# Patient Record
Sex: Male | Born: 1985 | Race: White | Hispanic: No | Marital: Married | Smoking: Never smoker
Health system: Southern US, Community
[De-identification: ages and names within clinical notes are randomized; demographics above are authoritative.]

## PROBLEM LIST (undated history)

## (undated) DIAGNOSIS — G43909 Migraine, unspecified, not intractable, without status migrainosus: Secondary | ICD-10-CM

## (undated) HISTORY — PX: MEDIAL COLLATERAL LIGAMENT REPAIR, KNEE: SHX2019

## (undated) HISTORY — DX: Migraine, unspecified, not intractable, without status migrainosus: G43.909

## (undated) HISTORY — PX: LYMPH NODE BIOPSY: SHX201

## (undated) HISTORY — PX: ANTERIOR CRUCIATE LIGAMENT REPAIR: SHX115

## (undated) HISTORY — PX: OTHER SURGICAL HISTORY: SHX169

---

## 2014-10-06 ENCOUNTER — Other Ambulatory Visit: Payer: Self-pay | Admitting: Family Medicine

## 2014-10-06 ENCOUNTER — Ambulatory Visit
Admission: RE | Admit: 2014-10-06 | Discharge: 2014-10-06 | Disposition: A | Discharge status: Home / Self Care | Payer: BLUE CROSS/BLUE SHIELD | Source: Ambulatory Visit | Attending: Family Medicine | Admitting: Family Medicine

## 2014-10-06 DIAGNOSIS — R1031 Right lower quadrant pain: Secondary | ICD-10-CM

## 2014-10-06 MED ORDER — IOHEXOL 350 MG/ML SOLN
100.0000 mL | Freq: Once | INTRAVENOUS | Status: AC | PRN
  Administered 2014-10-06: 100 mL via INTRAVENOUS

## 2015-12-15 IMAGING — CT CT ABD-PELV W/ CM
2 of 5 series · 16 of 46 positions shown, 18 images · IV contrast (omnipaque)
Comparison: None.

CLINICAL DATA: Right lower quadrant pain.  Rule out appendicitis.

EXAM:
CT ABDOMEN AND PELVIS WITH CONTRAST
TECHNIQUE: Multidetector CT imaging of the abdomen and pelvis was performed
using the standard protocol following bolus administration of
intravenous contrast.
CONTRAST:  100mL OMNIPAQUE IOHEXOL 350 MG/ML SOLN

[Series 2: routine with · axial · 0.79mm/px · z∈[-1066,-591]mm · 13 of 105 slices shown, 15 images]
[im 5/105  soft-tissue]
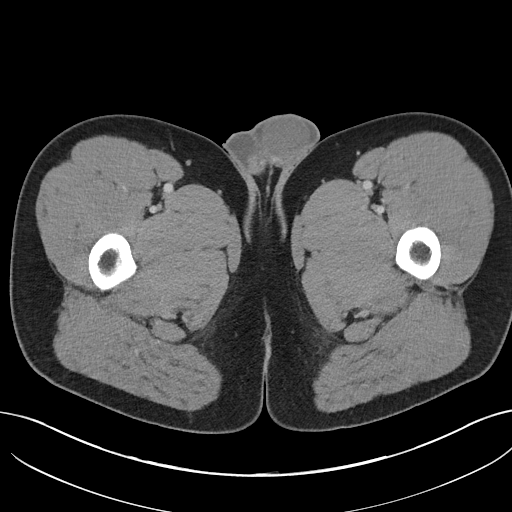
[im 5/105  bone]
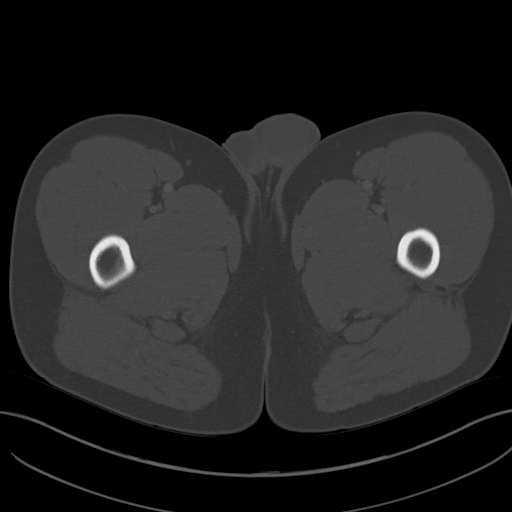
[im 14/105  soft-tissue]
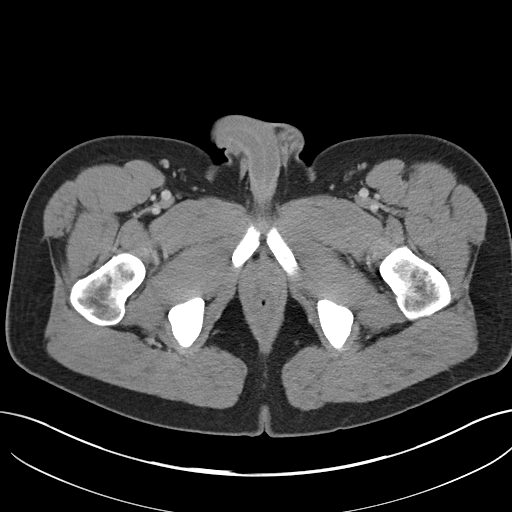
[im 23/105  soft-tissue]
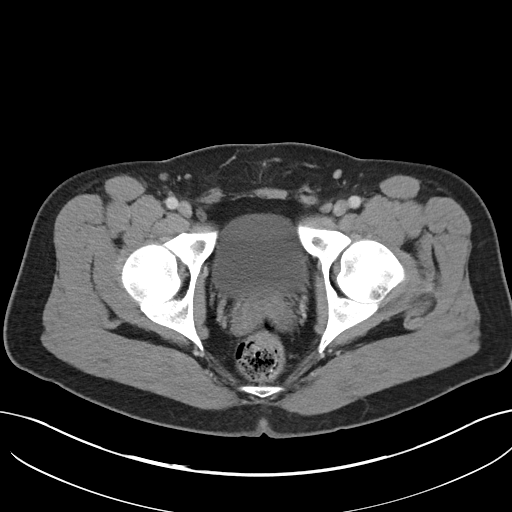
[im 28/105  soft-tissue]
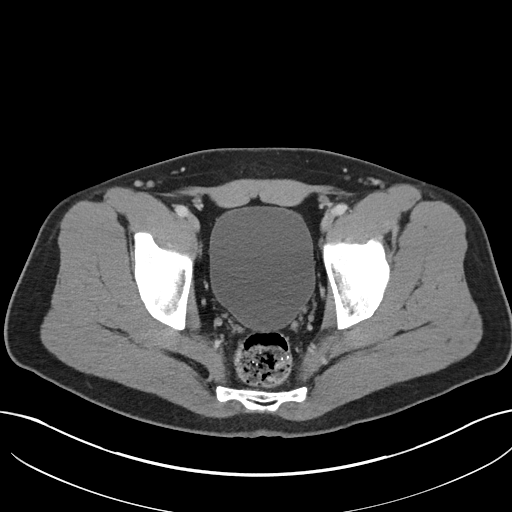
[im 37/105  soft-tissue]
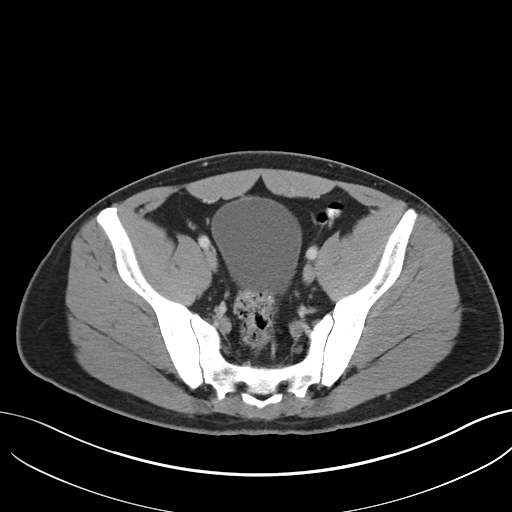
[im 46/105  soft-tissue]
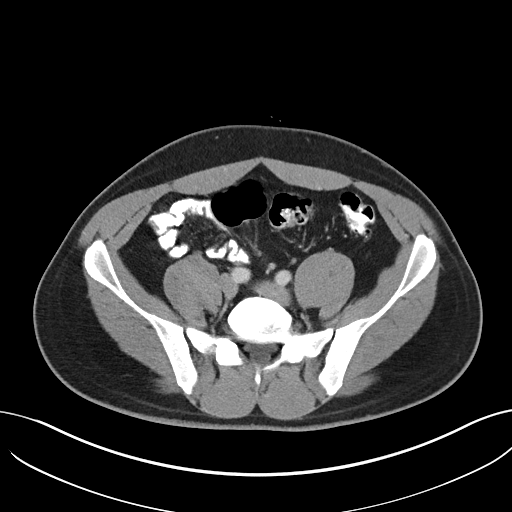
[im 55/105  soft-tissue]
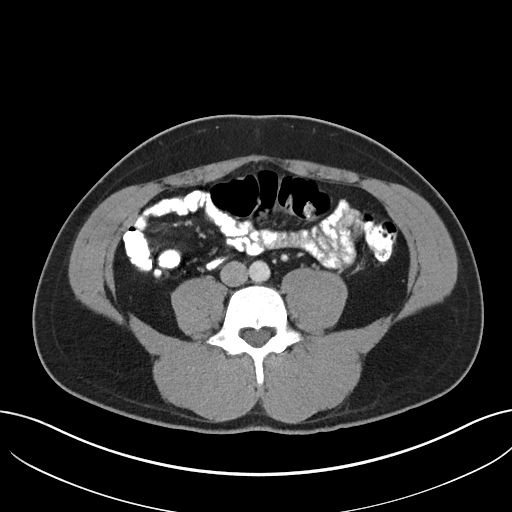
[im 59/105  soft-tissue]
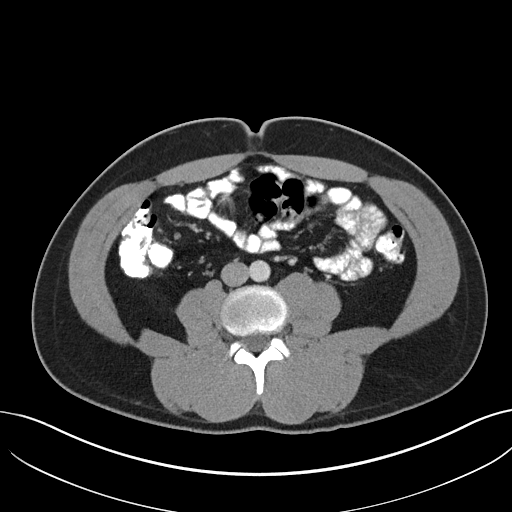
[im 68/105  soft-tissue]
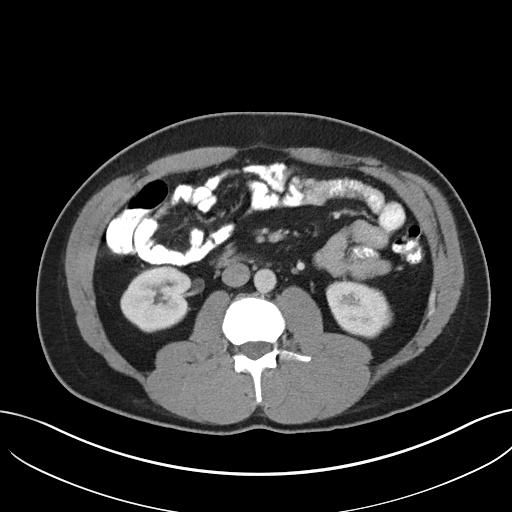
[im 68/105  bone]
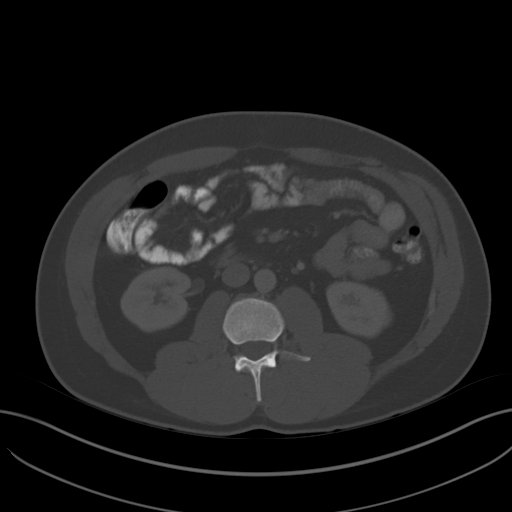
[im 77/105  soft-tissue]
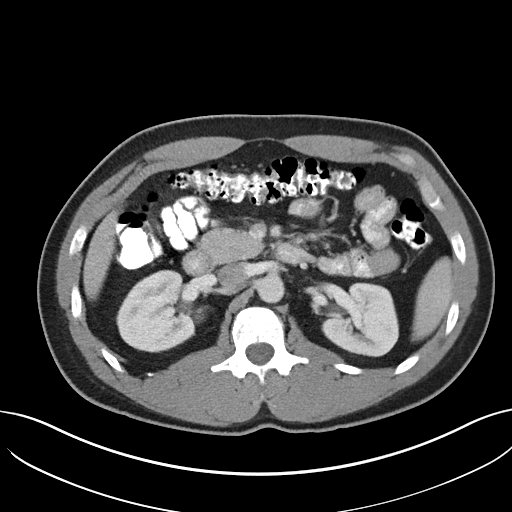
[im 82/105  soft-tissue]
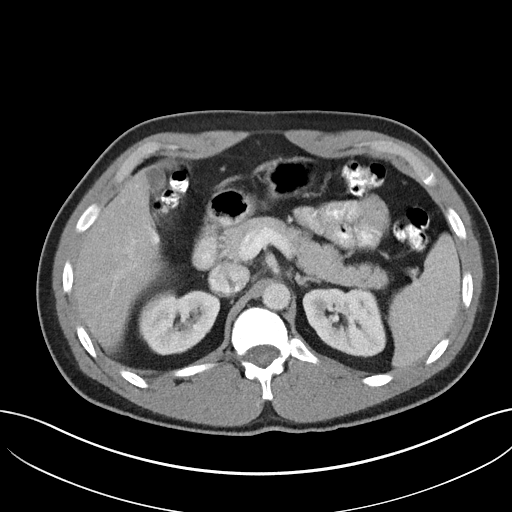
[im 91/105  soft-tissue]
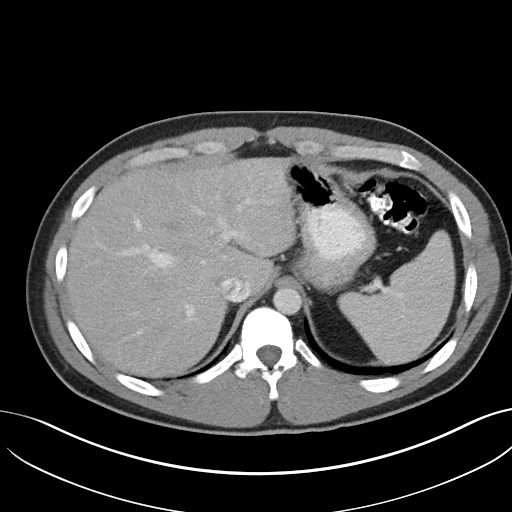
[im 100/105  soft-tissue]
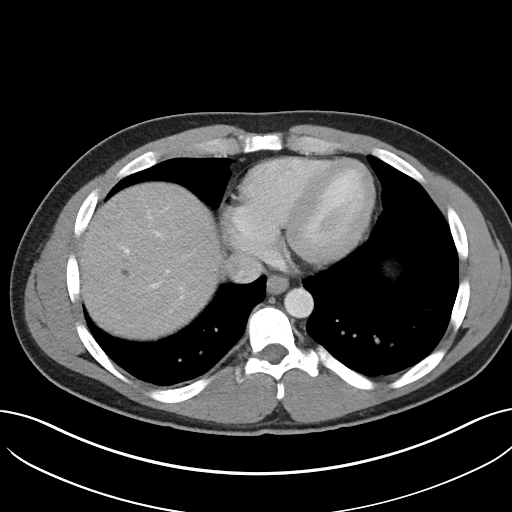

[Series 8: cor routine with · coronal · 0.80mm/px · 3 of 137 slices shown]
[im 46/137  soft-tissue]
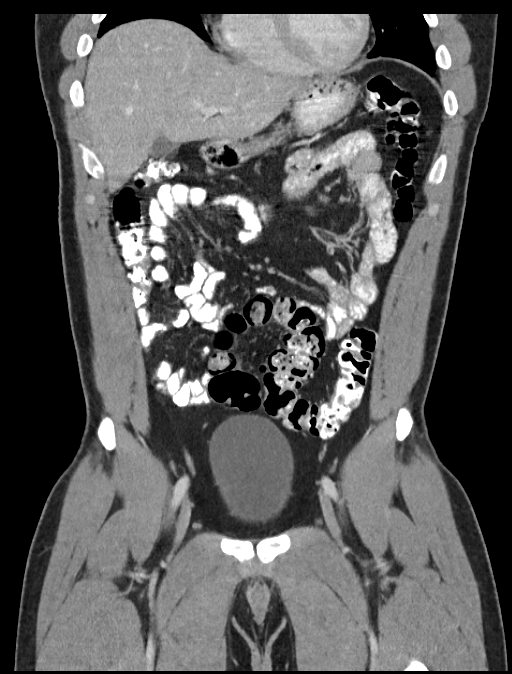
[im 61/137  soft-tissue]
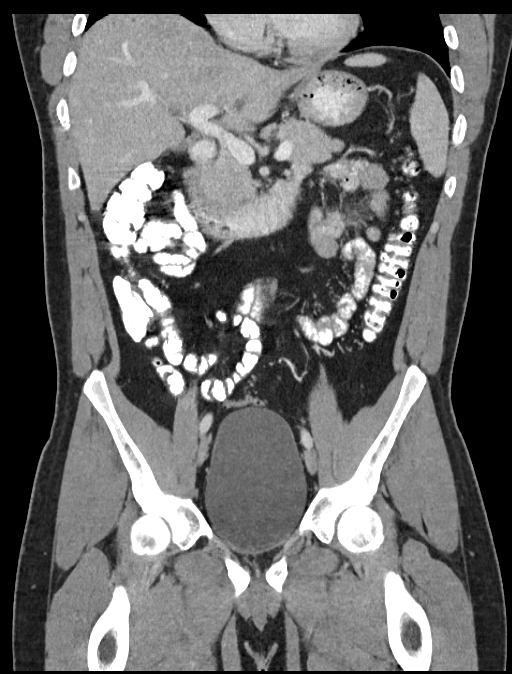
[im 76/137  soft-tissue]
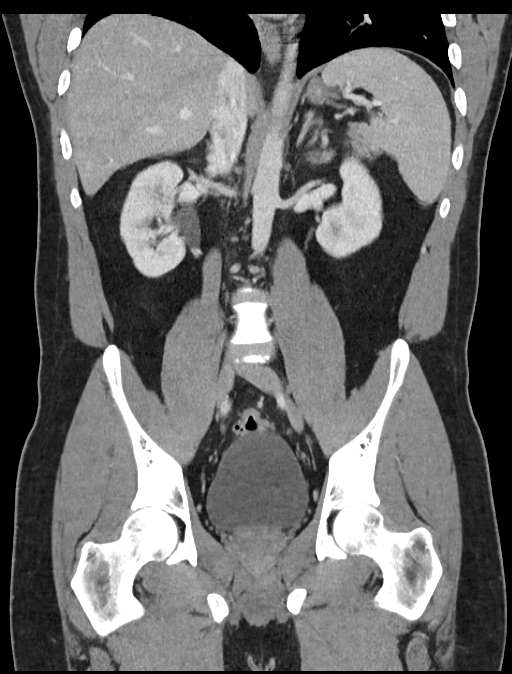

[16 of 46 positions shown; findings below may reference images not displayed]

FINDINGS: Lung bases are clear.  Negative for infiltrate or effusion.

1 cm cyst in the dome of the liver. No liver mass lesion. Normal
spleen. Normal gallbladder and bile ducts. Normal pancreas

Normal kidneys. No renal mass or obstruction. No renal calculi.
Urinary bladder normal.

Negative for bowel obstruction. Normal appendix. Negative for bowel
edema. No free fluid.

Negative for mass or adenopathy.

Normal lumbar spine.
IMPRESSION: No acute abnormality.  Normal appendix.

These results will be called to the ordering clinician or
representative by the Radiologist Assistant, and communication
documented in the PACS or zVision Dashboard.

## 2016-08-05 ENCOUNTER — Ambulatory Visit: Payer: BLUE CROSS/BLUE SHIELD | Admitting: Family Medicine

## 2016-09-01 ENCOUNTER — Ambulatory Visit (INDEPENDENT_AMBULATORY_CARE_PROVIDER_SITE_OTHER): Payer: 59 | Admitting: Family Medicine

## 2016-09-01 ENCOUNTER — Encounter: Payer: Self-pay | Admitting: Family Medicine

## 2016-09-01 VITALS — BP 138/96 | HR 76 | Temp 98.0°F | Ht 71.0 in | Wt 232.2 lb

## 2016-09-01 DIAGNOSIS — Z23 Encounter for immunization: Secondary | ICD-10-CM | POA: Diagnosis not present

## 2016-09-01 DIAGNOSIS — Z0000 Encounter for general adult medical examination without abnormal findings: Secondary | ICD-10-CM | POA: Insufficient documentation

## 2016-09-01 LAB — COMPREHENSIVE METABOLIC PANEL
ALBUMIN: 5 g/dL (ref 3.5–5.2)
ALT: 52 U/L (ref 0–53)
AST: 37 U/L (ref 0–37)
Alkaline Phosphatase: 44 U/L (ref 39–117)
BUN: 11 mg/dL (ref 6–23)
CO2: 25 mEq/L (ref 19–32)
Calcium: 9.8 mg/dL (ref 8.4–10.5)
Chloride: 105 mEq/L (ref 96–112)
Creatinine, Ser: 0.93 mg/dL (ref 0.40–1.50)
GFR: 100.61 mL/min (ref >60.00)
GLUCOSE: 86 mg/dL (ref 70–99)
Potassium: 4.5 mEq/L (ref 3.5–5.1)
SODIUM: 138 meq/L (ref 135–145)
TOTAL PROTEIN: 7.4 g/dL (ref 6.0–8.3)
Total Bilirubin: 0.4 mg/dL (ref 0.2–1.2)

## 2016-09-01 LAB — LIPID PANEL
CHOL/HDL RATIO: 7
Cholesterol: 182 mg/dL (ref 0–200)
HDL: 25.5 mg/dL — ABNORMAL LOW (ref >39.00)
NonHDL: 156.49
Triglycerides: 228 mg/dL — ABNORMAL HIGH (ref 0.0–149.0)
VLDL: 45.6 mg/dL — AB (ref 0.0–40.0)

## 2016-09-01 LAB — CBC
HCT: 46.9 % (ref 39.0–52.0)
Hemoglobin: 16.1 g/dL (ref 13.0–17.0)
MCHC: 34.3 g/dL (ref 30.0–36.0)
MCV: 82.8 fl (ref 78.0–100.0)
PLATELETS: 229 10*3/uL (ref 150.0–400.0)
RBC: 5.67 Mil/uL (ref 4.22–5.81)
RDW: 13.6 % (ref 11.5–15.5)
WBC: 7.4 10*3/uL (ref 4.0–10.5)

## 2016-09-01 LAB — HEMOGLOBIN A1C: Hgb A1c MFr Bld: 4.5 % — ABNORMAL LOW (ref 4.6–6.5)

## 2016-09-01 LAB — LDL CHOLESTEROL, DIRECT: LDL DIRECT: 135 mg/dL

## 2016-09-01 NOTE — Addendum Note (Signed)
Addended by: Alisia FerrariBARE, KRISTEN C on: 09/01/2016 11:45 AM   Modules accepted: Orders

## 2016-09-01 NOTE — Assessment & Plan Note (Signed)
Labs today. Tdap given today. Advised continued exercise and to watch diet/alcohol intake. BP mildly elevated today. We'll continue to monitor.

## 2016-09-01 NOTE — Progress Notes (Signed)
Subjective:  Patient ID: Jeffery Kirk, male    DOB: 13-Jul-1985  Age: 31 y.o. MRN: [id number]  CC: Physical  HPI Jeffery Kirk is a 31 y.o. male presents to the clinic today to establish care and with a physical.  Preventative Healthcare  Immunizations  Tetanus - In need of Tdap (expecting 1st child later this year).  Labs: Screening labs today.   Exercise: Lifts/cardio 3x/week  Alcohol use: Yes. See below.  Smoking/tobacco use: No.  Regular dental exams: Yes.   Wears seat belt: Yes.   PMH, Surgical Hx, Family Hx, Social History reviewed and updated as below.  Past Medical History:  Diagnosis Date  . Migraine    Past Surgical History:  Procedure Laterality Date  . ANTERIOR CRUCIATE LIGAMENT REPAIR    . LYMPH NODE BIOPSY    . MEDIAL COLLATERAL LIGAMENT REPAIR, KNEE    . thumb surgery     Ligament repair   Family History  Problem Relation Age of Onset  . Hypertension Mother   . Mental illness Mother   . Hypertension Father   . Alcohol abuse Father   . Hypertension Maternal Grandmother   . Hypertension Maternal Grandfather   . Hypertension Paternal Grandmother   . Hypertension Paternal Grandfather    Social History  Substance Use Topics  . Smoking status: Never Smoker  . Smokeless tobacco: Former Neurosurgeon  . Alcohol use 4.2 oz/week    4 Cans of beer, 3 Shots of liquor per week    Review of Systems General: Denies unexplained weight loss, fever. Skin: Denies new or changing mole, sore/wound that won't heal. ENT: Trouble hearing, ringing in the ears, sores in the mouth, hoarseness, trouble swallowing. Eyes: Denies trouble seeing/visual disturbance. Heart/CV: Denies chest pain, shortness of breath, edema, palpitations. Lungs/Resp: Denies cough, shortness of breath, hemoptysis. Abd/GI: Denies nausea, vomiting, diarrhea, constipation, abdominal pain, hematochezia, melena. GU: Denies dysuria, incontinence, hematuria, urinary frequency, difficulty  starting/keeping stream, penile discharge, sexual difficulty, lump in testicles. MSK: Denies joint pain/swelling, myalgias. Neuro: Denies headaches, weakness, numbness, dizziness, syncope. Psych: Denies sadness, anxiety, stress, memory difficulty. Endocrine: Denies polyuria and polydipsia.  Objective:   Today's Vitals: BP (!) 138/96 (BP Location: Right Arm, Patient Position: Sitting, Cuff Size: Large)   Pulse 76   Temp 98 F (36.7 C) (Oral)   Ht 5\' 11"  (1.803 m)   Wt 232 lb 4 oz (105.3 kg)   SpO2 98%   BMI 32.39 kg/m   Physical Exam  Constitutional: He is oriented to person, place, and time. He appears well-developed. No distress.  HENT:  Head: Normocephalic and atraumatic.  Mouth/Throat: Oropharynx is clear and moist.  Eyes: Conjunctivae are normal. No scleral icterus.  Neck: Neck supple.  Cardiovascular: Normal rate and regular rhythm.   Pulmonary/Chest: Effort normal and breath sounds normal.  Abdominal: Soft. He exhibits no distension. There is no tenderness. There is no rebound and no guarding.  Musculoskeletal: Normal range of motion.  Lymphadenopathy:    He has no cervical adenopathy.  Neurological: He is alert and oriented to person, place, and time.  Skin: Skin is warm. No rash noted.  Psychiatric: He has a normal mood and affect.  Vitals reviewed.  Assessment & Plan:   Problem List Items Addressed This Visit    Annual physical exam - Primary    Labs today. Tdap given today. Advised continued exercise and to watch diet/alcohol intake. BP mildly elevated today. We'll continue to monitor.      Relevant Orders  CBC   Hemoglobin A1c   Comprehensive metabolic panel   Lipid panel     Follow-up: Annually  Everlene OtherJayce Tanga Gloor DO [facility] [place]

## 2016-09-01 NOTE — Patient Instructions (Signed)
Follow up annually.  Call me with concerns.  Take care  Dr. Adriana Simasook    Health Maintenance, Male A healthy lifestyle and preventive care is important for your health and wellness. Ask your health care provider about what schedule of regular examinations is right for you. What should I know about weight and diet?  Eat a Healthy Diet  Eat plenty of vegetables, fruits, whole grains, low-fat dairy products, and lean protein.  Do not eat a lot of foods high in solid fats, added sugars, or salt. Maintain a Healthy Weight  Regular exercise can help you achieve or maintain a healthy weight. You should:  Do at least 150 minutes of exercise each week. The exercise should increase your heart rate and make you sweat (moderate-intensity exercise).  Do strength-training exercises at least twice a week. Watch Your Levels of Cholesterol and Blood Lipids  Have your blood tested for lipids and cholesterol every 5 years starting at 31 years of age. If you are at high risk for heart disease, you should start having your blood tested when you are 31 years old. You may need to have your cholesterol levels checked more often if:  Your lipid or cholesterol levels are high.  You are older than 31 years of age.  You are at high risk for heart disease. What should I know about cancer screening? Many types of cancers can be detected early and may often be prevented. Lung Cancer  You should be screened every year for lung cancer if:  You are a current smoker who has smoked for at least 30 years.  You are a former smoker who has quit within the past 15 years.  Talk to your health care provider about your screening options, when you should start screening, and how often you should be screened. Colorectal Cancer  Routine colorectal cancer screening usually begins at 31 years of age and should be repeated every 5-10 years until you are 31 years old. You may need to be screened more often if early forms of  precancerous polyps or small growths are found. Your health care provider may recommend screening at an earlier age if you have risk factors for colon cancer.  Your health care provider may recommend using home test kits to check for hidden blood in the stool.  A small camera at the end of a tube can be used to examine your colon (sigmoidoscopy or colonoscopy). This checks for the earliest forms of colorectal cancer. Prostate and Testicular Cancer  Depending on your age and overall health, your health care provider may do certain tests to screen for prostate and testicular cancer.  Talk to your health care provider about any symptoms or concerns you have about testicular or prostate cancer. Skin Cancer  Check your skin from head to toe regularly.  Tell your health care provider about any new moles or changes in moles, especially if:  There is a change in a mole's size, shape, or color.  You have a mole that is larger than a pencil eraser.  Always use sunscreen. Apply sunscreen liberally and repeat throughout the day.  Protect yourself by wearing long sleeves, pants, a wide-brimmed hat, and sunglasses when outside. What should I know about heart disease, diabetes, and high blood pressure?  If you are 3218-31 years of age, have your blood pressure checked every 3-5 years. If you are 740 years of age or older, have your blood pressure checked every year. You should have your blood pressure measured  twice-once when you are at a hospital or clinic, and once when you are not at a hospital or clinic. Record the average of the two measurements. To check your blood pressure when you are not at a hospital or clinic, you can use:  An automated blood pressure machine at a pharmacy.  A home blood pressure monitor.  Talk to your health care provider about your target blood pressure.  If you are between 55-65 years old, ask your health care provider if you should take aspirin to prevent heart  disease.  Have regular diabetes screenings by checking your fasting blood sugar level.  If you are at a normal weight and have a low risk for diabetes, have this test once every three years after the age of 41.  If you are overweight and have a high risk for diabetes, consider being tested at a younger age or more often.  A one-time screening for abdominal aortic aneurysm (AAA) by ultrasound is recommended for men aged 3-75 years who are current or former smokers. What should I know about preventing infection? Hepatitis B  If you have a higher risk for hepatitis B, you should be screened for this virus. Talk with your health care provider to find out if you are at risk for hepatitis B infection. Hepatitis C  Blood testing is recommended for:  Everyone born from [birth year] through 1965.  Anyone with known risk factors for hepatitis C. Sexually Transmitted Diseases (STDs)  You should be screened each year for STDs including gonorrhea and chlamydia if:  You are sexually active and are younger than 31 years of age.  You are older than 31 years of age and your health care provider tells you that you are at risk for this type of infection.  Your sexual activity has changed since you were last screened and you are at an increased risk for chlamydia or gonorrhea. Ask your health care provider if you are at risk.  Talk with your health care provider about whether you are at high risk of being infected with HIV. Your health care provider may recommend a prescription medicine to help prevent HIV infection. What else can I do?  Schedule regular health, dental, and eye exams.  Stay current with your vaccines (immunizations).  Do not use any tobacco products, such as cigarettes, chewing tobacco, and e-cigarettes. If you need help quitting, ask your health care provider.  Limit alcohol intake to no more than 2 drinks per day. One drink equals 12 ounces of beer, 5 ounces of wine, or 1 ounces of hard  liquor.  Do not use street drugs.  Do not share needles.  Ask your health care provider for help if you need support or information about quitting drugs.  Tell your health care provider if you often feel depressed.  Tell your health care provider if you have ever been abused or do not feel safe at home. This information is not intended to replace advice given to you by your health care provider. Make sure you discuss any questions you have with your health care provider. Document Released: 10/08/2007 Document Revised: 12/09/2015 Document Reviewed: 01/13/2015 Elsevier Interactive Patient Education  2017 [employer].

## 2018-10-07 ENCOUNTER — Telehealth: Payer: Self-pay | Admitting: Family Medicine

## 2018-10-07 MED ORDER — KETOROLAC TROMETHAMINE 10 MG PO TABS: 10.0000 mg | ORAL_TABLET | Freq: Four times a day (QID) | ORAL | 0 refills | Status: AC | PRN

## 2018-10-07 NOTE — Telephone Encounter (Signed)
Former patient of mine. Suffered 5th metatarsal fracture. Was seen at local urgent care. No boot or post op shoe given. No meds given for pain. Sending in Toradol PRN. Patient to see me tomorrow for evaluation.

## 2018-10-08 ENCOUNTER — Ambulatory Visit
Admission: EM | Admit: 2018-10-08 | Discharge: 2018-10-08 | Disposition: A | Discharge status: Home / Self Care | Payer: BC Managed Care – PPO | Attending: Family Medicine | Admitting: Family Medicine

## 2018-10-08 ENCOUNTER — Other Ambulatory Visit: Payer: Self-pay

## 2018-10-08 DIAGNOSIS — S99921A Unspecified injury of right foot, initial encounter: Secondary | ICD-10-CM

## 2018-10-08 DIAGNOSIS — S92354A Nondisplaced fracture of fifth metatarsal bone, right foot, initial encounter for closed fracture: Secondary | ICD-10-CM

## 2018-10-08 MED ORDER — TRAMADOL HCL 50 MG PO TABS: 50.0000 mg | ORAL_TABLET | Freq: Three times a day (TID) | ORAL | 0 refills | Status: AC | PRN

## 2018-10-08 NOTE — ED Triage Notes (Signed)
Patient complains of right foot fracture. Patient states that he was seen by [facility] in [place] and was told he had a hairline fracture of his foot. Patient states that he was not given anything to use as far as DME.

## 2018-10-08 NOTE — Discharge Instructions (Signed)
Rest.  Ice, elevation.  Wear boot with activity.  Medication as directed.

## 2018-10-08 NOTE — ED Provider Notes (Signed)
[facility]    CSN: [id number] Arrival date & time: 10/08/18  0802    History   Chief Complaint Chief Complaint  Patient presents with  . Foot Injury   HPI  33 year old male presents with a foot injury.  Patient reports that he suffered an inversion injury yesterday.  He was evaluated at a local urgent care.  Was diagnosed with an ankle sprain.  Was also told that he had a his metatarsal fracture.  He reports that he has had relief in his pain with Toradol (I sent this in for him yesterday as he is a former patient of mine).  However, he continues to have pain.  Currently 5/10 in severity.  Worse with activity.  Denies ankle pain at this time.  Patient states that he was not given crutches or any other DME to help with activity.  He is requesting this today.  He has been icing and wrapping the area with some improvement as well.  No other associated symptoms.  No other complaints.  PMH, Surgical Hx, Family Hx, Social History reviewed and updated as below.  Past Medical History:  Diagnosis Date  . Migraine    Patient Active Problem List   Diagnosis Date Noted  . Annual physical exam 09/01/2016   Past Surgical History:  Procedure Laterality Date  . ANTERIOR CRUCIATE LIGAMENT REPAIR    . LYMPH NODE BIOPSY    . MEDIAL COLLATERAL LIGAMENT REPAIR, KNEE    . thumb surgery     Ligament repair   Home Medications    Prior to Admission medications   Medication Sig Start Date End Date Taking? Authorizing Provider  ketorolac (TORADOL) 10 MG tablet Take 1 tablet (10 mg total) by mouth every 6 (six) hours as needed for moderate pain or severe pain. 10/07/18  Yes Pasqual Farias G, DO  traMADol (ULTRAM) 50 MG tablet Take 1 tablet (50 mg total) by mouth every 8 (eight) hours as needed. 10/08/18   Tommie Samsook, Tia Hieronymus G, DO    Family History Family History  Problem Relation Age of Onset  . Hypertension Mother   . Mental illness Mother   . Hypertension Father   . Alcohol abuse Father    . Hypertension Maternal Grandmother   . Hypertension Maternal Grandfather   . Hypertension Paternal Grandmother   . Hypertension Paternal Grandfather     Social History Social History   Tobacco Use  . Smoking status: Never Smoker  . Smokeless tobacco: Former Engineer, waterUser  Substance Use Topics  . Alcohol use: Yes    Alcohol/week: 7.0 standard drinks    Types: 4 Cans of beer, 3 Shots of liquor per week  . Drug use: No     Allergies   Patient has no known allergies.   Review of Systems Review of Systems  Constitutional: Negative.   Musculoskeletal:       Right foot pain.   Physical Exam Triage Vital Signs ED Triage Vitals  Enc Vitals Group     BP 10/08/18 0813 (!) 144/102     Pulse Rate 10/08/18 0813 75     Resp 10/08/18 0813 16     Temp 10/08/18 0813 98.7 F (37.1 C)     Temp Source 10/08/18 0813 Oral     SpO2 10/08/18 0813 100 %     Weight 10/08/18 0811 225 lb (102.1 kg)     Height 10/08/18 0811 5\' 11"  (1.803 m)     Head Circumference --  Peak Flow --      Pain Score 10/08/18 0811 5     Pain Loc --      Pain Edu? --      Excl. in [place]? --    Updated Vital Signs BP (!) 144/102 (BP Location: Left Arm)   Pulse 75   Temp 98.7 F (37.1 C) (Oral)   Resp 16   Ht 5\' 11"  (1.803 m)   Wt 102.1 kg   SpO2 100%   BMI 31.38 kg/m   Visual Acuity Right Eye Distance:   Left Eye Distance:   Bilateral Distance:    Right Eye Near:   Left Eye Near:    Bilateral Near:     Physical Exam Vitals signs and nursing note reviewed.  Constitutional:      General: He is not in acute distress.    Appearance: Normal appearance.  HENT:     Head: Normocephalic and atraumatic.  Eyes:     General:        Right eye: No discharge.        Left eye: No discharge.     Conjunctiva/sclera: Conjunctivae normal.  Pulmonary:     Effort: Pulmonary effort is normal. No respiratory distress.  Musculoskeletal:     Comments: Right foot - discrete tenderness over the 5th metatarsal.  Skin:     General: Skin is warm.     Findings: No bruising.  Neurological:     Mental Status: He is alert.  Psychiatric:        Mood and Affect: Mood normal.        Behavior: Behavior normal.    UC Treatments / Results  Labs (all labs ordered are listed, but only abnormal results are displayed) Labs Reviewed - No data to display  EKG None  Radiology No results found.  Procedures Procedures (including critical care time)  Medications Ordered in UC Medications - No data to display  Initial Impression / Assessment and Plan / UC Course  I have reviewed the triage vital signs and the nursing notes.  Pertinent labs & imaging results that were available during my care of the patient were reviewed by me and considered in my medical decision making (see chart for details).    33 year old male presents with right foot injury.  S, ice, elevation.  CAM Walker to help with ambulation.  Advised to follow-up with podiatry.  Tramadol as needed for pain.  Final Clinical Impressions(s) / UC Diagnoses   Final diagnoses:  Injury of right foot, initial encounter  Closed nondisplaced fracture of fifth metatarsal bone of right foot, initial encounter     Discharge Instructions     Rest.  Ice, elevation.  Wear boot with activity.  Medication as directed.    ED Prescriptions    Medication Sig Dispense Auth. Provider   traMADol (ULTRAM) 50 MG tablet Take 1 tablet (50 mg total) by mouth every 8 (eight) hours as needed. 15 tablet Coral Spikes, DO     Controlled Substance Prescriptions [place] Controlled Substance Registry consulted? Not Applicable   Coral Spikes, [place] 10/08/18 [phone]

## 2019-10-15 DIAGNOSIS — R197 Diarrhea, unspecified: Secondary | ICD-10-CM | POA: Insufficient documentation

## 2019-10-15 DIAGNOSIS — R5383 Other fatigue: Secondary | ICD-10-CM | POA: Insufficient documentation

## 2019-11-13 DIAGNOSIS — R891 Abnormal level of hormones in specimens from other organs, systems and tissues: Secondary | ICD-10-CM | POA: Insufficient documentation

## 2019-11-13 DIAGNOSIS — E559 Vitamin D deficiency, unspecified: Secondary | ICD-10-CM | POA: Insufficient documentation

## 2019-11-13 DIAGNOSIS — E7800 Pure hypercholesterolemia, unspecified: Secondary | ICD-10-CM | POA: Insufficient documentation

## 2021-11-25 ENCOUNTER — Ambulatory Visit
Admission: EM | Admit: 2021-11-25 | Discharge: 2021-11-25 | Disposition: A | Discharge status: Home / Self Care | Payer: Managed Care, Other (non HMO) | Attending: Family Medicine | Admitting: Family Medicine

## 2021-11-25 DIAGNOSIS — S61012A Laceration without foreign body of left thumb without damage to nail, initial encounter: Secondary | ICD-10-CM | POA: Diagnosis not present

## 2021-11-25 NOTE — ED Triage Notes (Signed)
Patient to UC with laceration present to left thumb. Reports injury occurred this afternoon when slicing an onion. Bleeding well controlled at this time with guaze and band-aid in place.

## 2021-11-25 NOTE — Discharge Instructions (Signed)
[place] over the counter pain medication as needed.  You can take bandage off tomorrow.

## 2021-11-25 NOTE — ED Provider Notes (Signed)
[facility]    CSN: [id number] Arrival date & time: 11/25/21  1653      History   Chief Complaint Chief Complaint  Patient presents with   Laceration    HPI Jeffery Kirk is a 36 y.o. male   HPI  Patient injured his hand at home just prior to arrival.  States that he was cutting onions this evening and the knife slipped injuring his left thumb.  He has subsequent pain.  Denies difficulty moving it or loss in sensation.  The injury continues to bleed.  He applied gauze and a Band-Aid prior to arrival.States he had a tetanus shot not that long ago.  He is unsure the date but remembers it was when he injured another finger.  He is right-handed.  He has no other concerns.   Past Medical History:  Diagnosis Date   Migraine     Patient Active Problem List   Diagnosis Date Noted   Annual physical exam 09/01/2016    Past Surgical History:  Procedure Laterality Date   ANTERIOR CRUCIATE LIGAMENT REPAIR     LYMPH NODE BIOPSY     MEDIAL COLLATERAL LIGAMENT REPAIR, KNEE     thumb surgery     Ligament repair       Home Medications    Prior to Admission medications   Medication Sig Start Date End Date Taking? Authorizing Provider  ketorolac (TORADOL) 10 MG tablet Take 1 tablet (10 mg total) by mouth every 6 (six) hours as needed for moderate pain or severe pain. 10/07/18   Tommie Sams, DO  traMADol (ULTRAM) 50 MG tablet Take 1 tablet (50 mg total) by mouth every 8 (eight) hours as needed. 10/08/18   Tommie Sams, DO    Family History Family History  Problem Relation Age of Onset   Hypertension Mother    Mental illness Mother    Hypertension Father    Alcohol abuse Father    Hypertension Maternal Grandmother    Hypertension Maternal Grandfather    Hypertension Paternal Grandmother    Hypertension Paternal Grandfather     Social History Social History   Tobacco Use   Smoking status: Never   Smokeless tobacco: Former  Building services engineer Use: Never  used  Substance Use Topics   Alcohol use: Yes    Alcohol/week: 7.0 standard drinks of alcohol    Types: 4 Cans of beer, 3 Shots of liquor per week   Drug use: No     Allergies   Patient has no known allergies.   Review of Systems Review of Systems :negative unless otherwise stated in HPI.      Physical Exam Triage Vital Signs ED Triage Vitals  Enc Vitals Group     BP 11/25/21 1808 (!) 143/98     Pulse Rate 11/25/21 1808 80     Resp 11/25/21 1808 18     Temp 11/25/21 1808 98.1 F (36.7 C)     Temp Source 11/25/21 1808 Oral     SpO2 11/25/21 1808 99 %     Weight 11/25/21 1809 220 lb (99.8 kg)     Height 11/25/21 1809 5\' 11"  (1.803 m)     Head Circumference --      Peak Flow --      Pain Score 11/25/21 1809 3     Pain Loc --      Pain Edu? --      Excl. in GC? --    No  data found.  Updated Vital Signs BP (!) 143/98   Pulse 80   Temp 98.1 F (36.7 C) (Oral)   Resp 18   Ht 5\' 11"  (1.803 m)   Wt 99.8 kg   SpO2 99%   BMI 30.68 kg/m   Visual Acuity Right Eye Distance:   Left Eye Distance:   Bilateral Distance:    Right Eye Near:   Left Eye Near:    Bilateral Near:     Physical Exam  GEN: well appearing male  CVS: well perfused  RESP: speaking in full sentences without pause, no respiratory distress  MSK: Normal range of motion of left thumb, gross sensation intact, able to make the okay sign without difficulty, strength 5/5 in all directions SKIN: Bandage removed, 1.5 cm laceration to distal left thumb, there is a shallow skin flap, mild bleeding present, see image below     UC Treatments / Results  Labs (all labs ordered are listed, but only abnormal results are displayed) Labs Reviewed - No data to display  EKG   Radiology No results found.  Procedures Laceration Repair  Date/Time: 11/25/2021 11:05 PM  Performed by: 01/25/2022, DO Authorized by: Katha Cabal, DO   Consent:    Consent obtained:  Verbal   Consent given by:   Patient   Risks, benefits, and alternatives were discussed: yes     Alternatives discussed:  No treatment Anesthesia:    Anesthesia method:  Local infiltration   Local anesthetic:  Lidocaine 1% w/o epi Laceration details:    Location:  Finger   Finger location:  L thumb   Length (cm):  15 Exploration:    Limited defect created (wound extended): yes     Hemostasis achieved with:  Direct pressure   Imaging outcome: foreign body not noted     Wound exploration: entire depth of wound visualized     Contaminated: no   Treatment:    Area cleansed with:  Soap and water   Amount of cleaning:  Standard Skin repair:    Repair method:  Tissue adhesive Approximation:    Approximation:  Close Post-procedure details:    Dressing:  Tube gauze   Procedure completion:  Tolerated  (including critical care time)  Medications Ordered in UC Medications - No data to display  Initial Impression / Assessment and Plan / UC Course  I have reviewed the triage vital signs and the nursing notes.  Pertinent labs & imaging results that were available during my care of the patient were reviewed by me and considered in my medical decision making (see chart for details).      Pt is a 36 y.o. male who presents after injuring left thumb location at home this afternoon. Laceration was repaired after shared decision making patient chose to close the area with Dermabond. Pt's last tentaus was 09/22/2020, per chart review.  Tetanus is up-to-date. Home care instructions provided. OTC analgesics as needed for pain.   Final Clinical Impressions(s) / UC Diagnoses   Final diagnoses:  Laceration of left thumb without foreign body without damage to nail, initial encounter     Discharge Instructions      [place] over the counter pain medication as needed.  You can take bandage off tomorrow.       ED Prescriptions   None    PDMP not reviewed this encounter.   09/24/2020, DO 11/25/21 2307

## 2022-06-21 DIAGNOSIS — E781 Pure hyperglyceridemia: Secondary | ICD-10-CM | POA: Insufficient documentation

## 2022-08-23 ENCOUNTER — Other Ambulatory Visit: Payer: Self-pay | Admitting: Orthopedic Surgery

## 2022-08-23 DIAGNOSIS — M25562 Pain in left knee: Secondary | ICD-10-CM

## 2022-08-30 ENCOUNTER — Encounter: Payer: Self-pay | Admitting: Orthopedic Surgery

## 2022-09-02 ENCOUNTER — Inpatient Hospital Stay: Admission: RE | Admit: 2022-09-02 | Payer: Managed Care, Other (non HMO) | Source: Ambulatory Visit

## 2022-09-30 ENCOUNTER — Inpatient Hospital Stay: Admission: RE | Admit: 2022-09-30 | Payer: Managed Care, Other (non HMO) | Source: Ambulatory Visit

## 2024-05-21 ENCOUNTER — Ambulatory Visit
Admission: EM | Admit: 2024-05-21 | Discharge: 2024-05-21 | Disposition: A | Discharge status: Home / Self Care | Attending: Physician Assistant | Admitting: Physician Assistant

## 2024-05-21 DIAGNOSIS — E291 Testicular hypofunction: Secondary | ICD-10-CM | POA: Insufficient documentation

## 2024-05-21 DIAGNOSIS — H7291 Unspecified perforation of tympanic membrane, right ear: Secondary | ICD-10-CM | POA: Diagnosis not present

## 2024-05-21 DIAGNOSIS — Z6832 Body mass index (BMI) 32.0-32.9, adult: Secondary | ICD-10-CM | POA: Insufficient documentation

## 2024-05-21 MED ORDER — AMOXICILLIN-POT CLAVULANATE 875-125 MG PO TABS: 1.0000 | ORAL_TABLET | Freq: Two times a day (BID) | ORAL | 0 refills | Status: AC

## 2024-05-21 MED ORDER — OFLOXACIN 0.3 % OT SOLN: 10.0000 [drp] | Freq: Every day | OTIC | 0 refills | Status: AC

## 2024-05-21 NOTE — Discharge Instructions (Signed)
-   Eardrum rupture. - I sent an antibiotic oral medication as well as eardrops to the pharmacy. - Symptoms should gradually improve. - If hearing is not improving within the next couple weeks, pain worsens, significant bleeding, fever go to emergency department.

## 2024-05-21 NOTE — ED Provider Notes (Signed)
 " [facility]    CSN: [id number] Arrival date & time: 05/21/24  1142      History   Chief Complaint Chief Complaint  Patient presents with   Otalgia    HPI Jeffery Kirk is a 39 y.o. male presenting for right ear discomfort, fullness and hearing loss since last night.  He also reports purulent drainage and bleeding from the right ear.  Patient states he was diagnosed with the flu via telemedicine encounter about a week ago.  Reports history of ruptured eardrum and feels that he has another ruptured eardrum.  He denies fever and cough and congestion have improved.  No other complaints.  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 Authorizing Provider  amoxicillin -clavulanate (AUGMENTIN ) 875-125 MG tablet Take 1 tablet by mouth every 12 (twelve) hours for 7 days. 05/21/24 05/28/24 Yes Arvis Jolan NOVAK, PA-C  ofloxacin  (FLOXIN ) 0.3 % OTIC solution Place 10 drops into the right ear daily for 7 days. 05/21/24 05/28/24 Yes Arvis Jolan NOVAK, PA-C  ketorolac  (TORADOL ) 10 MG tablet Take 1 tablet (10 mg total) by mouth every 6 (six) hours as needed for moderate pain or severe pain. 10/07/18   Cook, Jayce G, DO  traMADol  (ULTRAM ) 50 MG tablet Take 1 tablet (50 mg total) by  mouth every 8 (eight) hours as needed. 10/08/18   Cook, Jayce G, DO    Family History Family History  Problem Relation Age of Onset   Hypertension Mother    Mental illness Mother    Hypertension Father    Alcohol abuse Father    Hypertension Maternal Grandmother    Hypertension Maternal Grandfather    Hypertension Paternal Grandmother    Hypertension Paternal Grandfather     Social History Social History[1]   Allergies   Patient has no known allergies.   Review of Systems Review of Systems  Constitutional:  Negative for fatigue and fever.  HENT:  Positive for ear discharge, ear pain and hearing loss. Negative for congestion, rhinorrhea, sinus pressure, sinus pain and sore throat.   Respiratory:  Negative for cough and shortness of breath.   Cardiovascular:  Negative for chest pain.  Gastrointestinal:  Negative for diarrhea, nausea and vomiting.  Musculoskeletal:  Negative for myalgias.  Neurological:  Negative for dizziness, weakness and headaches.  Hematological:  Negative for adenopathy.     Physical Exam Triage Vital Signs ED Triage Vitals  Encounter Vitals Group     BP 05/21/24 1226 (!) 151/89     Girls Systolic BP Percentile --      Girls Diastolic BP Percentile --      Boys Systolic BP Percentile --      Boys Diastolic BP Percentile --  Pulse Rate 05/21/24 1226 96     Resp 05/21/24 1226 18     Temp 05/21/24 1226 99 F (37.2 C)     Temp Source 05/21/24 1226 Oral     SpO2 05/21/24 1226 97 %     Weight 05/21/24 1224 225 lb 3.2 oz (102.2 kg)     Height --      Head Circumference --      Peak Flow --      Pain Score 05/21/24 1225 2     Pain Loc --      Pain Education --      Exclude from Growth Chart --    No data found.  Updated Vital Signs BP (!) 151/89 (BP Location: Right Arm)   Pulse 96   Temp 99 F (37.2 C) (Oral)   Resp 18   Wt 225 lb 3.2 oz (102.2 kg)   SpO2 97%   BMI 31.41 kg/m    Physical Exam Vitals and nursing note reviewed.   Constitutional:      General: He is not in acute distress.    Appearance: Normal appearance. He is well-developed. He is not ill-appearing.  HENT:     Head: Normocephalic and atraumatic.     Comments: Purulent debris with blood in right EAC. Unable to visualize entire TM, but I suspect perforation.    Right Ear: External ear normal.     Left Ear: Tympanic membrane, ear canal and external ear normal.     Nose: Nose normal.     Mouth/Throat:     Mouth: Mucous membranes are moist.     Pharynx: Oropharynx is clear.  Eyes:     Conjunctiva/sclera: Conjunctivae normal.  Cardiovascular:     Rate and Rhythm: Normal rate and regular rhythm.  Pulmonary:     Effort: Pulmonary effort is normal. No respiratory distress.     Breath sounds: Normal breath sounds.  Musculoskeletal:     Cervical back: Neck supple.  Skin:    General: Skin is warm and dry.     Capillary Refill: Capillary refill takes less than 2 seconds.  Neurological:     General: No focal deficit present.     Mental Status: He is alert. Mental status is at baseline.     Motor: No weakness.     Gait: Gait normal.  Psychiatric:        Mood and Affect: Mood normal.        Behavior: Behavior normal.      UC Treatments / Results  Labs (all labs ordered are listed, but only abnormal results are displayed) Labs Reviewed - No data to display  EKG   Radiology No results found.  Procedures Procedures (including critical care time)  Medications Ordered in UC Medications - No data to display  Initial Impression / Assessment and Plan / UC Course  I have reviewed the triage vital signs and the nursing notes.  Pertinent labs & imaging results that were available during my care of the patient were reviewed by me and considered in my medical decision making (see chart for details).   40 year old male presents for right ear discomfort, fullness and hearing loss since last night.  He has been congested for the past week related  to viral illness.  Reports history of ruptured eardrum and believes he ruptured it again.  On evaluation, there is purulent debris with blood in the ear canal. Unable to visualize entire TM but I suspect perforation.   Will  treat at this time with Vantin and ofloxacin .  Supportive care advised.  Reviewed return and follow-up precautions.   Final Clinical Impressions(s) / UC Diagnoses   Final diagnoses:  Perforation of right tympanic membrane     Discharge Instructions      - Eardrum rupture. - I sent an antibiotic oral medication as well as eardrops to the pharmacy. - Symptoms should gradually improve. - If hearing is not improving within the next couple weeks, pain worsens, significant bleeding, fever go to emergency department.     ED Prescriptions     Medication Sig Dispense Auth. Provider   amoxicillin -clavulanate (AUGMENTIN ) 875-125 MG tablet Take 1 tablet by mouth every 12 (twelve) hours for 7 days. 14 tablet Arvis Huxley B, PA-C   ofloxacin  (FLOXIN ) 0.3 % OTIC solution Place 10 drops into the right ear daily for 7 days. 5 mL Arvis Huxley NOVAK, PA-C      PDMP not reviewed this encounter.     [1]  Social History Tobacco Use   Smoking status: Never   Smokeless tobacco: Former  Building Services Engineer status: Never Used  Substance Use Topics   Alcohol use: Yes    Alcohol/week: 7.0 standard drinks of alcohol    Types: 4 Cans of beer, 3 Shots of liquor per week   Drug use: No     Arvis Huxley NOVAK, PA-C 05/21/24 1249  "

## 2024-05-21 NOTE — ED Triage Notes (Signed)
 Pt c/o R ear pain & drainage x1 day. States getting over the flu & feels like ear drum ruptured. Has tried IBU w/o relief.
